# Patient Record
Sex: Female | Born: 1955 | Race: White | Hispanic: No | Marital: Married
Health system: Midwestern US, Community
[De-identification: ages and names within clinical notes are randomized; demographics above are authoritative.]

---

## 2020-02-05 IMAGING — CT CT Abdomen and Pelvis W-Contrast
2 of 3 series · 15 of 46 positions shown, 17 images · IV contrast (omnipaque)
Comparison: None available at time of this report.

CT Abdomen and Pelvis W-Contrast
INDICATION: Abdominal Pain.                                                              
 Pertinent History: Patient states she has not had a bowel movement in 14 days. States she 
 is now having upper abdominal pain that radiates into her chest.                          
 Surgical History:                                                                         
 Cancer: None                                                                              
 GFR (past 30 days): >39 ml/min                                                            
 Metformin: None                                                                           
 Lipase: N/A       Amylase: N/A      WBC: N/A                                              
 Intravenous contrast: 100 mL Omnipaque 350                                                
 Oral contrast: No                                                                         
 Technologist Comments: None
TECHNIQUE: Routine with IV contrast. Helical acquisition with sagittal and coronal        
 reformations; post IV contrast images.                                                    
 Utilized dose reduction techniques include: Automated Exposure Control, vendor specific   
 iterative reconstruction technique

[Series 4: ax post · axial · 0.62mm/px · z∈[+369,+774]mm · 12 of 93 slices shown, 14 images]
[im 6/93  soft-tissue]
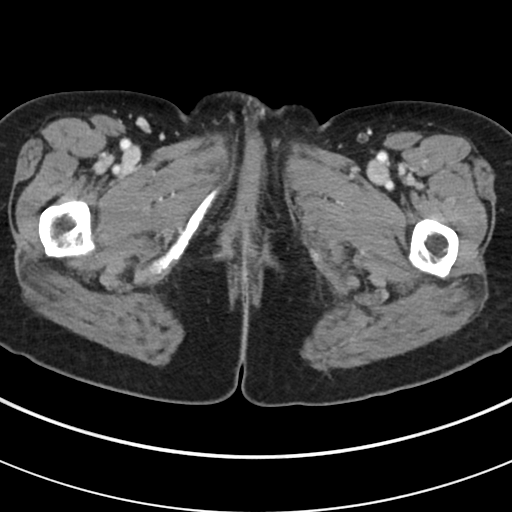
[im 6/93  bone]
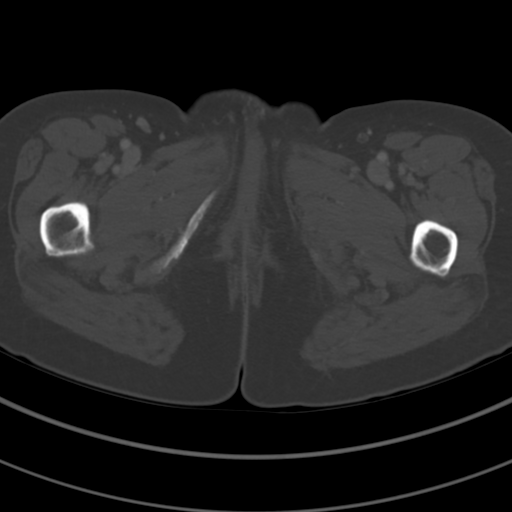
[im 12/93  soft-tissue]
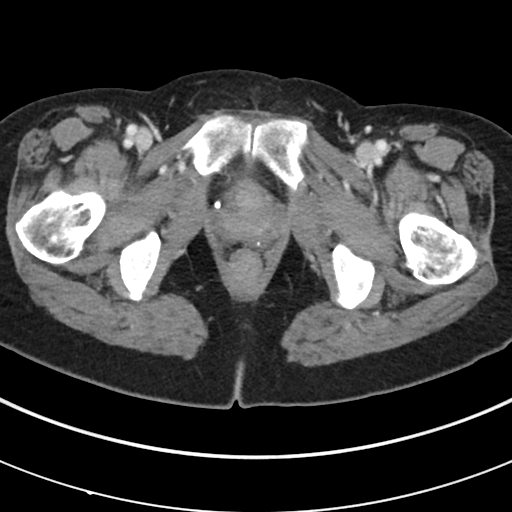
[im 21/93  soft-tissue]
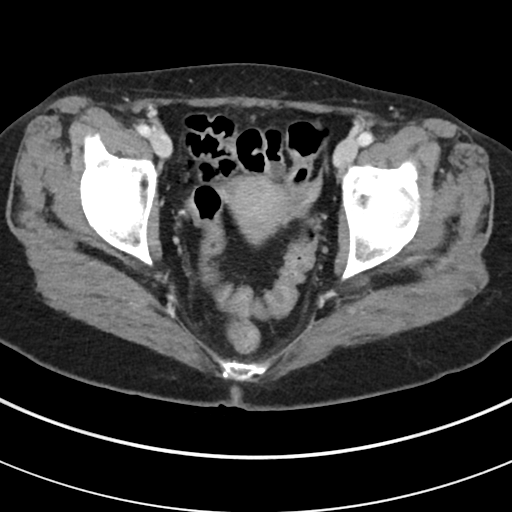
[im 27/93  soft-tissue]
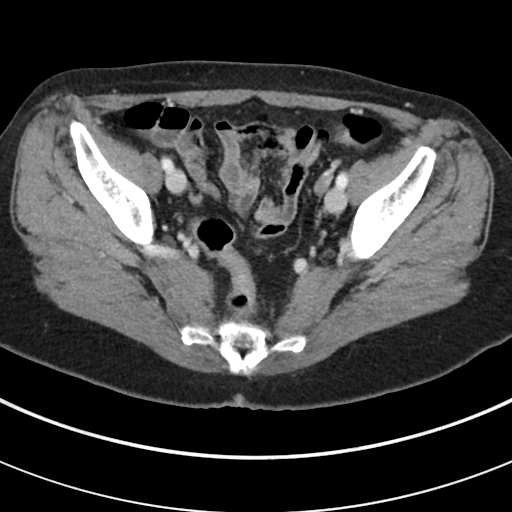
[im 36/93  soft-tissue]
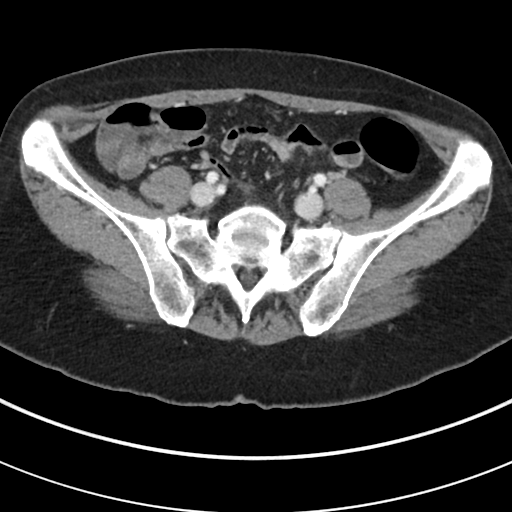
[im 42/93  soft-tissue]
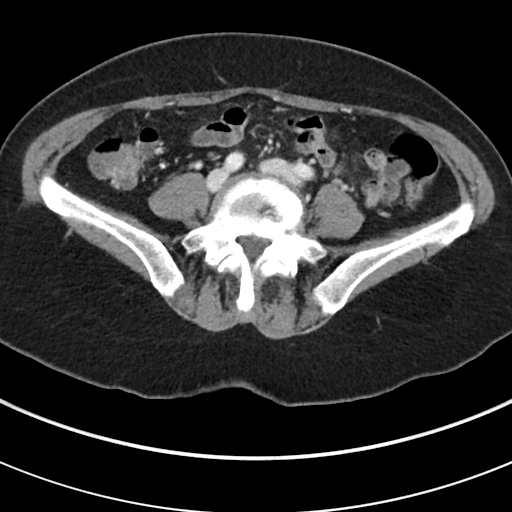
[im 51/93  soft-tissue]
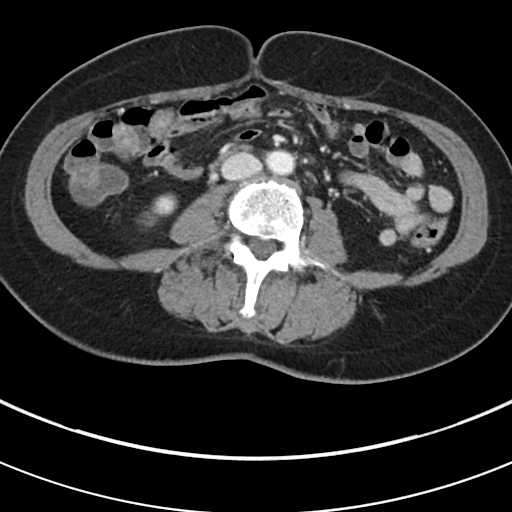
[im 57/93  soft-tissue]
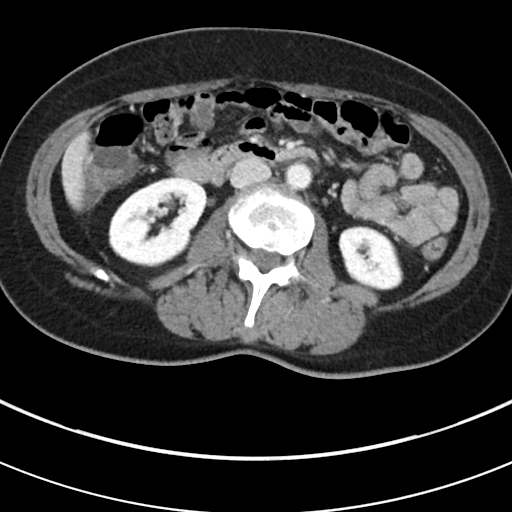
[im 66/93  soft-tissue]
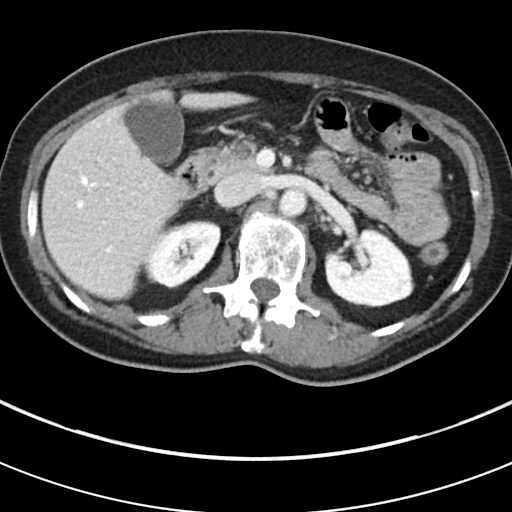
[im 66/93  bone]
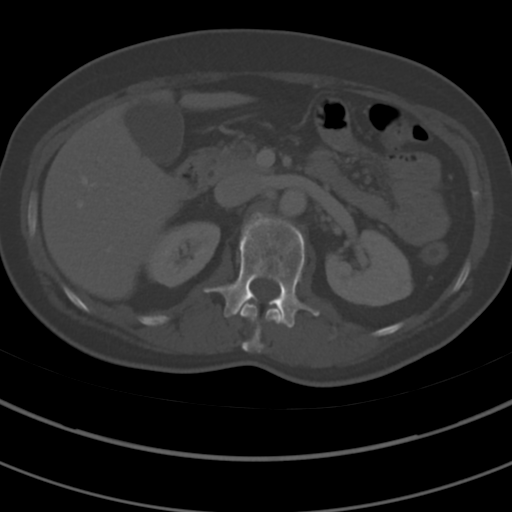
[im 72/93  soft-tissue]
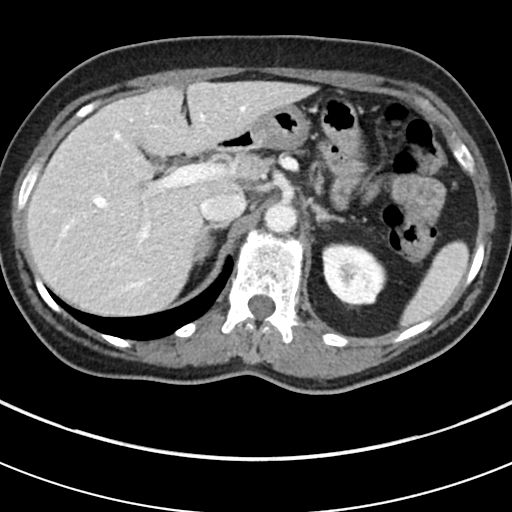
[im 81/93  soft-tissue]
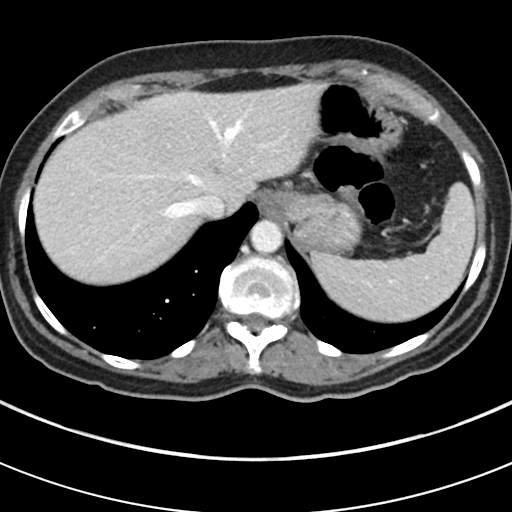
[im 87/93  soft-tissue]
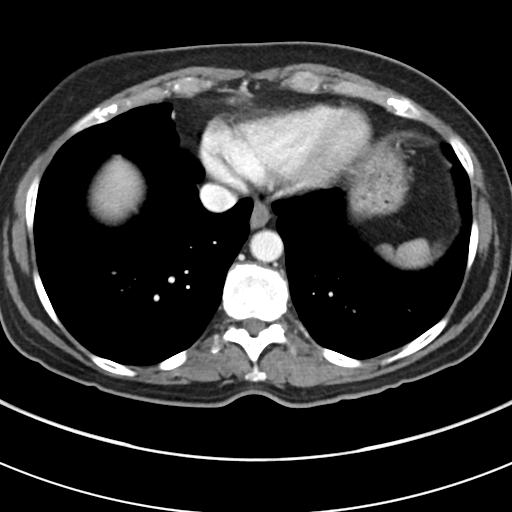

[Series 5: cor post · coronal · 0.75mm/px · 3 of 43 slices shown]
[im 15/43  soft-tissue]
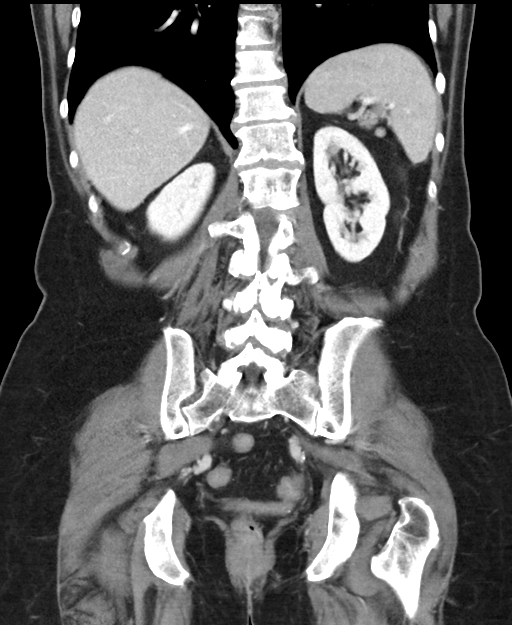
[im 19/43  soft-tissue]
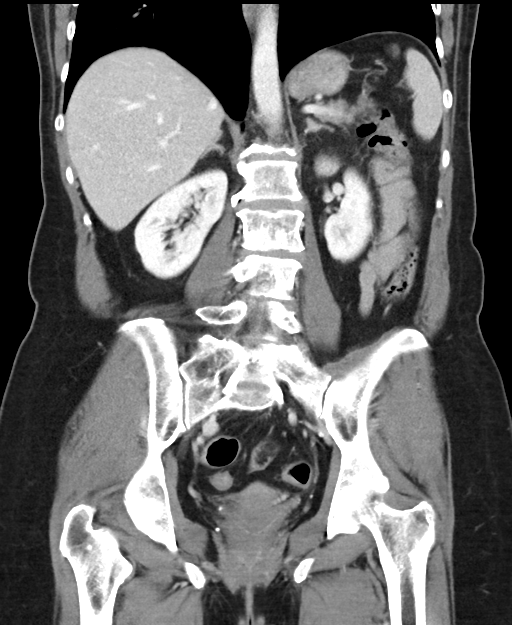
[im 24/43  soft-tissue]
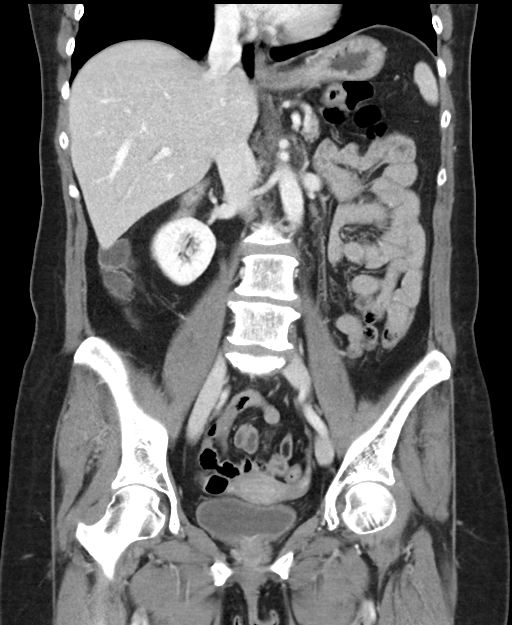

[15 of 46 positions shown; findings below may reference images not displayed]

FINDINGS: Cardiac size within normal limits. No pleural effusions. Minimal atelectasis lung bases.  
 Asymmetric elevation left hemidiaphragm.                                                  
 No focal discrete enhancing hepatic or splenic lesion.                                    
 Mild fatty infiltration of pancreas. Adrenal glands unremarkable.                         
 Gallbladder present. No extrahepatic biliary ductal dilatation.                           
 Kidneys enhance symmetrically with no hydronephrosis.                                     
 Visualized, unopacified small bowel loops not dilated. Liquid stool noted within the      
 ascending colon. No CT findings identified to suggest acute diverticulitis. Poor          
 distention of the colon. No CT findings to suggest overt constipation. Appendix is normal 
 and partially air-filled.                                                                 
 No enlarged retroperitoneal or pelvic lymph node.                                         
 Urinary bladder mildly distended. Uterus present. No free fluid in pelvis.                
 Degenerative changes visualized thoracolumbar spine with leftward curvature of the lumbar 
 spine. Small lucent lesion with coarse internal trabeculation at T12 could reflect a      
 small intraosseous hemangioma.
IMPRESSION: 1. No dilated loops of small bowel. Liquid stool ascending colon could reflect diarrhea.  
 No CT findings to suggest acute diverticulitis or overt constipation. Normal              
 appendix.                                                                                 
 2. Mild circumferential urinary bladder wall thickening could relate to underdistention.  
 If concern for cystitis, consider correlation with urinalysis.

## 2020-10-10 ENCOUNTER — Ambulatory Visit: Admit: 2020-10-10 | Discharge: 2020-10-10 | Payer: BLUE CROSS/BLUE SHIELD | Attending: Nurse Practitioner

## 2020-10-10 DIAGNOSIS — S82832A Other fracture of upper and lower end of left fibula, initial encounter for closed fracture: Secondary | ICD-10-CM

## 2020-10-10 NOTE — Patient Instructions (Addendum)
Utilize rest, ice, compression, elevation in addition to OTC Tylenol and ibuprofen.  Keep boot on at all times aside from icing.  Crutches to maintain nonweightbearing status.  Follow-up with orthopedics.  Patient Education        Broken Ankle: Care Instructions  Your Care Instructions     An ankle may break (fracture) during sports, a fall, or other accidents. Fractures can range from a small, hairline crack, to a bone or bones brokeninto two or more pieces. Your treatment depends on how bad the break is.  Your doctor may have put your ankle in a splint or cast to allow it to heal or to keep it stable until you see another doctor. It may take weeks or months foryour ankle to heal. You can help your ankle heal with some care at home.  You heal best when you take good care of yourself. Eat a variety of healthyfoods, and don't smoke.  You may have had a sedative to help you relax. You may be unsteady after having sedation. It can take a few hours for the medicine's effects to wear off. Common side effects of sedation include nausea, vomiting, and feeling sleepy ortired.  The doctor has checked you carefully, but problems can develop later. If you notice any problems or new symptoms,  get medical treatment right away.  Follow-up care is a key part of your treatment and safety. Be sure to make and go to all appointments, and call your doctor if you are having problems. It's also a good idea to know your test results and keep alist of the medicines you take.  How can you care for yourself at home?  ??? If the doctor gave you a sedative:  ? For 24 hours, don't do anything that requires attention to detail, such as going to work, making important decisions, or signing any legal documents. It takes time for the medicine's effects to completely wear off.  ? For your safety, do not drive or operate any machinery that could be dangerous. Wait until the medicine wears off and you can think clearly and react easily.  ??? Put ice or  a cold pack on your ankle for 10 to 20 minutes at a time. Try to do this every 1 to 2 hours for the next 3 days (when you are awake). Put a thin cloth between the ice and your cast or splint. Keep your cast or splint dry.  ??? Follow the cast care instructions your doctor gives you. If you have a splint, do not take it off unless your doctor tells you to.  ??? Be safe with medicines. Take pain medicines exactly as directed.  ? If the doctor gave you a prescription medicine for pain, take it as prescribed.  ? If you are not taking a prescription pain medicine, ask your doctor if you can take an over-the-counter medicine.  ??? Prop up your leg on pillows in the first few days after the injury. Keep the ankle higher than the level of your heart. This will help reduce swelling.  ??? Do not put weight on your ankle unless your doctor tells you to. Use crutches to walk.  ??? Follow instructions for exercises to keep your leg strong.  ??? Wiggle your toes often to reduce swelling and stiffness.  When should you call for help?   Call 911 anytime you think you may need emergency care. For example, call if:  ?? ??? You have chest pain, are short of  breath, or you cough up blood.   ?? ??? You are very sleepy and you have trouble waking up.   Call your doctor now or seek immediate medical care if:  ?? ??? You have new or worse nausea or vomiting.   ?? ??? You have new or worse pain.   ?? ??? Your foot is cool or pale or changes color.   ?? ??? You have tingling, weakness, or numbness in your toes.   ?? ??? Your cast or splint feels too tight.   ?? ??? You have signs of a blood clot in your leg (called a deep vein thrombosis), such as:  ? Pain in your calf, back of the knee, thigh, or groin.  ? Redness or swelling in your leg.   Watch closely for changes in your health, and be sure to contact your doctor if:  ?? ??? You have a problem with your splint or cast.   ?? ??? You do not get better as expected.   Where can you learn more?  Go to  [email or web address] and sign in to your MyChart account. Enter [phone] in the Search Health Information box to learn more about "Broken Ankle: Care Instructions."     If you do not have an account, please click on the "Sign Up Now" link.  Current as of: December 19, 2019??????????????????????????????Content Version: 13.2  ?? 2006-2022 [employer].   Care instructions adapted under license by [facility]. If you have questions about a medical condition or this instruction, always ask your healthcare professional. [employer] disclaims any warranty or liability for your use of this information.         Patient Education        Learning About How to Use Crutches  Overview  Crutches can help you walk when you have an injured hip, leg, knee, ankle, orfoot. Your doctor will tell you how much weight--if any--you can put on your leg.  Be sure your crutches fit you. When you stand up in your normal posture, there should be space for two or three fingers between the top of the crutch and your armpit. When you let your hands hang down, the hand grips should be at your wrists. When you put your hands on the hand grips, your elbows should beslightly bent.  To stay safe when using crutches:  ??? Look straight ahead, not down at your feet.  ??? Clear away small rugs, cords, or anything else that could cause you to trip, slip, or fall.  ??? Be very careful around pets and small children. They can get in your path when you least expect it.  ??? Be sure the rubber tips on your crutches are clean and in good condition to help prevent slipping.  ??? Avoid slick conditions, such as wet floors and snowy or icy driveways. In bad weather, be extra careful on curbs and steps.  How to use crutches  Getting ready to walk    1. Bend your elbows slightly. Press the padded top parts of the crutches against your sides, under your armpits.  2. If you have been told not to put any weight on your injured leg, keep that leg bent and off  the ground.  How to walk with crutches when you can put weight on the injured leg    Crutches allow you to take all the weight off of one leg. They can also be used as an added support if you have some injury or condition  of both legs. Here's how to walk with crutches when you're able to put weight on your weak orinjured leg.  Be sure your crutches fit you.  ??? When you stand up in your normal posture, there should be space for two or three fingers between the top of the crutch and your underarm.  ??? When you let your hands hang down, the hand grips should be at your wrists.  ??? When you put your hands on the hand grips, your elbows should be slightly bent.  1. Put both crutches about 12 inches in front of you.  2. Put your weight on the handgrips, not on the pads under your arms.  3. Move your weak or injured leg forward so it's almost even with the crutches.  4. Bring your good leg up, so it's even with your weak or injured leg.  5. Move your crutches about 12 inches in front of you, and start the next step.  The crutches and your feet should form a triangle. Hold the crutches close enough to your body so you can push straight down on them, but leave room between the crutches for your body to pass through. Don't lean forward to reachfarther.  Constant pressure against your underarms can cause numbness.  When you're confident using the crutches, you can move the crutches and your injured leg at the same time. Then push straight down on the crutches as youstep past the crutches with your strong leg, as you would in normal walking.  Take small steps. Use ramps and elevators when you can.  Sitting down    1. To sit, back up to the chair. Use one hand to hold both crutches by the handgrips, beside your injured leg. With the other hand, hold onto the seat and slowly lower yourself onto the chair.  2. Lay the crutches on the ground near your chair. If you prop them up, they may fall over.  Getting up from a chair    1. To  get up from a chair, pick up the crutches and put them in one hand beside your injured leg.  2. Put your weight on the handgrips of the crutches and on your strong leg to stand up.  How to go up and down stairs using crutches    Here's how to go up and down stairs using crutches.  Try this first with another person nearby to steady you if needed.  1. Stand near the edge of the stairs.  2. Go up or down the stairs.  ??? If you are going up, step up with your stronger leg. Then bring the crutches and your weak or injured leg to the upper step.  ??? If you are going down, put your crutches and your weak or injured leg on the lower step. Then bring your stronger leg down to the lower step.  Remember "up with the good, and down with the bad" to help you lead with thecorrect leg.  Crutches: How to go up and down stairs with handrails    If the stairs have a good sturdy handrail, you can hold it with one hand andyour crutches together in the other hand. Here's how.  Try this first with another person nearby to steady you if needed.  If the stairs have a handrail but you don't think it's sturdy enough, use thecrutches normally, holding one in each hand.  1. Stand near the edge of the stairs.  2. Put both crutches under the  arm opposite the handrail.  3. Use the hand opposite the handrail to hold both crutches by the handgrips.  4. Hold onto the handrail as you go up or down.  5. Go up or down the stairs.  ??? If you are going up, step up with your stronger leg. Then bring the crutches and your weak or injured leg to the upper step.  ??? If you are going down, put your crutches and your weak or injured leg on the lower step. Then bring your stronger leg down to the lower step.  Remember "up with the good, down with the bad" to help you lead with thecorrect leg.  Follow-up care is a key part of your treatment and safety. Be sure to make and go to all appointments, and call your doctor if you are having problems. It's also a good idea  to know your test results and keep alist of the medicines you take.  Where can you learn more?  Go to [email or web address] and sign in to your MyChart account. Enter [phone] in the Search Health Information box to learn more about "Learning About How to Use Crutches."     If you do not have an account, please click on the "Sign Up Now" link.  Current as of: December 19, 2019??????????????????????????????Content Version: 13.2  ?? 2006-2022 [employer].   Care instructions adapted under license by [facility]. If you have questions about a medical condition or this instruction, always ask your healthcare professional. [employer] disclaims any warranty or liability for your use of this information.         Patient Education        Learning About RICE (Rest, Ice, Compression, and Elevation)  What is RICE?    RICE is a way to care for an injury. RICE helps relieve pain and swelling. Itmay also help with healing and flexibility. RICE stands for:  ??? R est and protect the injured or sore area.  ??? I ce or a cold pack used as soon as possible.  ??? C ompression, or wrapping the injured or sore area with an elastic bandage.  ??? E levation (propping up) the injured or sore area.  How do you do RICE?  You can use RICE for home treatment when you have general aches and pains orafter an injury or surgery.  Rest  ??? Do not put weight on the injury for at least 24 to 48 hours.  ??? Use crutches for a badly sprained knee or ankle.  ??? Support a sprained wrist, elbow, or shoulder with a sling.  Ice  ??? Put ice or a cold pack on the injury right away to reduce pain and swelling. Frozen vegetables will also work as an ice pack. Put a thin cloth between the ice or cold pack and your skin. The cloth protects the injured area from getting too cold.  ??? Use ice for 10 to 15 minutes at a time for the first 48 to 72 hours.  Compression  ??? Use compression for sprains, strains, and surgeries of the arms and legs.  ??? Wrap the  injured area with an elastic bandage or compression sleeve to reduce swelling.  ??? Don't wrap it too tightly. If the area below it feels numb, tingles, or feels cool, loosen the wrap.  Elevation  ??? Use elevation for areas of the body that can be propped up, such as arms and legs.  ??? Prop up the injured area on pillows whenever you  use ice. Keep it propped up anytime you sit or lie down.  ??? Try to keep the injured area at or above the level of your heart. This will help reduce swelling and bruising.  Where can you learn more?  Go to [email or web address] and sign in to your MyChart account. Enter [phone] in the Search Health Information box to learn more about "Learning About RICE (Rest, Ice, Compression, and Elevation)."     If you do not have an account, please click on the "Sign Up Now" link.  Current as of: December 19, 2019??????????????????????????????Content Version: 13.2  ?? 2006-2022 [employer].   Care instructions adapted under license by [facility]. If you have questions about a medical condition or this instruction, always ask your healthcare professional. [employer] disclaims any warranty or liability for your use of this information.

## 2020-10-10 NOTE — Progress Notes (Signed)
Patient fitted with Tall Med. Boot and Med crutches per the order of Ernest Haber NP   .  Patient given care instructions for device. Patient verbalized comfort and satisfaction of fit. [facility]

## 2020-10-10 NOTE — Progress Notes (Signed)
Subjective:     Patient: Karla Wagner is a 65 y.o. female     Patient presents urgent care with left ankle pain.  She states that yesterday she fell when getting off her RV.  She states that she twisted her ankle with the fall.  She is unsure of the actual mechanism.  Patient does states she did hit her head.  She states she had on a piece of wood.  She denies any LOC.  Patient is on aspirin daily.  Patient denies any headache, dizziness, blurred vision.  She has no neck or back pain.  No nausea, vomiting.  Patient is currently traveling the country via RV.    Patient denies any old surgeries or injuries to the ankle.  She has tried OTC Tylenol with no relief in symptoms.  Patient states that she has a history of poor circulation and has some numbness at baseline.  Denies any new numbness, tingling, decrease sensation.      Ankle Pain   The incident occurred 12 to 24 hours ago. The incident occurred at home. The injury mechanism was a fall and a twisting injury. The pain is present in the left ankle. The pain is at a severity of 8/10. The pain has been constant since onset. Associated symptoms include an inability to bear weight. Pertinent negatives include no loss of motion, loss of sensation, muscle weakness, numbness or tingling. The symptoms are aggravated by movement and weight bearing. She has tried acetaminophen for the symptoms. The treatment provided mild relief.            Review of Systems   Constitutional: Positive for activity change. Negative for appetite change, chills, fatigue and fever.   HENT: Negative.    Eyes: Negative for photophobia and visual disturbance.   Respiratory: Negative for cough, choking, chest tightness and shortness of breath.    Cardiovascular: Negative for chest pain and palpitations.   Gastrointestinal: Negative for abdominal pain, nausea and vomiting.   Endocrine:        Non DM   Musculoskeletal: Positive for arthralgias, gait problem (difficulty bearing weight on L  ankle) and joint swelling (Left ankle). Negative for myalgias, neck pain and neck stiffness.   Skin: Positive for color change. Negative for pallor and rash.   Allergic/Immunologic: Negative for environmental allergies and food allergies.   Neurological: Negative for dizziness, tingling, facial asymmetry, speech difficulty, weakness, light-headedness, numbness and headaches.   Psychiatric/Behavioral: Negative for agitation, behavioral problems, confusion and decreased concentration.        No Known Allergies  Current Outpatient Medications on File Prior to Visit   Medication Sig Dispense Refill   ??? aspirin 81 MG EC tablet Take 1 tablet by mouth daily     ??? DULoxetine (CYMBALTA) 60 MG extended release capsule Take 60 mg by mouth daily     ??? folic acid (FOLVITE) 1 MG tablet TAKE 1 TABLET BY MOUTH ONCE DAILY     ??? Omega-3 Fatty Acids (FISH OIL) 1000 MG CAPS      ??? Cholecalciferol (VITAMIN D3) 125 MCG (5000 UT) TABS Take by mouth     ??? venlafaxine (EFFEXOR XR) 150 MG extended release capsule Take 150 mg by mouth daily (Patient not taking: Reported on 10/10/2020)       No current facility-administered medications on file prior to visit.      No past medical history on file.   Social History     Tobacco Use   ??? Smoking status:  Not on file   ??? Smokeless tobacco: Not on file   Substance Use Topics   ??? Alcohol use: Not on file          :     BP 130/62 (Site: Right Upper Arm, Position: Sitting, Cuff Size: Medium Adult)    Pulse 79    Temp 97 ??F (36.1 ??C) (Temporal)    Ht 5' 6.5" (1.689 m)    Wt 154 lb (69.9 kg)    SpO2 99%    BMI 24.48 kg/m??     Physical Exam  Vitals and nursing note reviewed.   Constitutional:       Appearance: Normal appearance. She is normal weight.   HENT:      Right Ear: Tympanic membrane, ear canal and external ear normal.      Left Ear: Tympanic membrane, ear canal and external ear normal.   Eyes:      Extraocular Movements: Extraocular movements intact.      Conjunctiva/sclera: Conjunctivae normal.       Pupils: Pupils are equal, round, and reactive to light.   Cardiovascular:      Rate and Rhythm: Normal rate and regular rhythm.      Heart sounds: Normal heart sounds.   Pulmonary:      Effort: Pulmonary effort is normal.      Breath sounds: Normal breath sounds.   Musculoskeletal:      Cervical back: Normal range of motion and neck supple.      Left ankle: Swelling and ecchymosis present. No deformity. Tenderness present over the lateral malleolus. No medial malleolus, base of 5th metatarsal or proximal fibula tenderness. Decreased range of motion. Anterior drawer test negative. Normal pulse.      Left Achilles Tendon: Normal.        Legs:       Comments: Patient has pain on palpation to the lateral malleolus.  There is some ecchymosis over the dorsal aspect of patient's foot.  No Achilles tendon tenderness.  No medial malleolus tenderness.  Patient does have decreased range of motion with plantarflexion and dorsiflexion.  Decreased strength in ankle at 3 out of 5.  Capillary refill is less than 3 seconds.  DP pulse 2+.  Sensation intact in foot.  No open areas.   Skin:     General: Skin is warm and dry.      Capillary Refill: Capillary refill takes less than 2 seconds.   Neurological:      General: No focal deficit present.      Mental Status: She is alert and oriented to person, place, and time. Mental status is at baseline.      Cranial Nerves: No cranial nerve deficit.      Sensory: No sensory deficit.      Comments: Patient pupils equal reactive bilaterally to light.  Speech clear, equal coherent.  Sensation intact in all 4 extremities.  Able to move all 4 extremities.  No extremity drift noted.  Facial features symmetric.  At this time, not able to assess gait due to unable to weight-bear on left ankle.   Psychiatric:         Mood and Affect: Mood normal.         Behavior: Behavior normal.         Thought Content: Thought content normal.         Judgment: Judgment normal.           No results found for this  visit  on 10/10/20.    Assessment      1. Other closed fracture of distal end of left fibula, initial encounter    2. Acute left ankle pain         Plan      1. Other closed fracture of distal end of left fibula, initial encounter  - [facility] Lower Extrem[facility] - [place]    2. Acute left ankle pain  - XR ANKLE LEFT (MIN 3 VIEWS)    Narrative   LEFT ANKLE   CLINICAL INDICATION: Pain after trauma   AP, lateral, and oblique plain film views of the left ankle were   obtained. ??   COMPARISON: None.   FINDINGS:   There is a nondisplaced oblique fracture of the left distal fibular   diaphysis. The fracture line is at the level of the ankle mortise,   which does not appear abnormally widened. No additional fracture or   dislocation of the left ankle is seen. There is lateral soft tissue   swelling. No radiopaque foreign body is identified.   ??   Impression   IMPRESSION:   Nondisplaced oblique fracture of the left distal fibular diaphysis   with adjacent soft tissue swelling.   Report Dictated on Workstation: [id number]   Electronically Signed By: Nicanor Alcon   Electronically Signed Date/Time: 10/10/2020 10:10       X-ray indicates a nondisplaced distal fibula fracture.  This was confirmed by radiology.    Patient placed in a long boot and given crutches.  Educated on use. She was educated remain nonweightbearing status until follow-up with orthopedics.  Boot should stay in place at all times.  She can remove for icing.  Orthopedic referral was placed as urgent.  She should call first thing Monday morning to make an appointment for soon as possible.  Patient was also given a disc with x-rays.  Patient is currently traveling the country via RV.  She will have disc available if she chooses another orthopedic facility.  I stressed the importance of following up.  Patient should utilize rest, ice, compression, elevation in addition to OTC Tylenol or ibuprofen as needed for pain relief.  Patient  should proceed to ER for worsening signs and symptoms at any point time.  Patient voiced understanding was agreeable treatment plan.    Patient was advised to proceed to ER since she did hit her head.  Patient declined.  AMA form was signed and scanned into the chart.  Patient aware of risks of not proceeding to ER.  Patient neuro exam benign at urgent care visit.  Patient should proceed to ER for headache, nausea, dizziness, sleepiness, or other worsening signs and symptoms.  Patient voiced understanding.  She Was agreeable.      Patient given educational materials - see patient instructions.  Discussed use, benefit, and side effects of prescribed medications.  All patient questions answered.  Pt voiced understanding and aware of treatment plan. Follow up as directed.    (Please note that portions of this note may have been completed with a voice recognition program. Efforts were made to edit the dictations but occasionally words are mis-transcribed.)    Ernest Haber, APRN - NP  10/10/20  10:25 AM

## 2020-10-10 NOTE — Progress Notes (Signed)
Pt verified name and birthday before xray exam today.  Pt was shielded  Not pregnant  Xray:   Left ankle
